# Patient Record
Sex: Female | Born: 1950 | Race: White | Hispanic: Yes | Marital: Married | Smoking: Never smoker
Health system: Southern US, Community
[De-identification: ages and names within clinical notes are randomized; demographics above are authoritative.]

## PROBLEM LIST (undated history)

## (undated) DIAGNOSIS — T4145XA Adverse effect of unspecified anesthetic, initial encounter: Secondary | ICD-10-CM

## (undated) DIAGNOSIS — T8859XA Other complications of anesthesia, initial encounter: Secondary | ICD-10-CM

## (undated) DIAGNOSIS — D649 Anemia, unspecified: Secondary | ICD-10-CM

## (undated) DIAGNOSIS — K625 Hemorrhage of anus and rectum: Secondary | ICD-10-CM

## (undated) HISTORY — DX: Anemia, unspecified: D64.9

## (undated) HISTORY — DX: Hemorrhage of anus and rectum: K62.5

## (undated) HISTORY — PX: CHOLECYSTECTOMY: SHX55

## (undated) HISTORY — PX: COLONOSCOPY: SHX174

---

## 1980-01-19 HISTORY — PX: TUBAL LIGATION: SHX77

## 2004-05-16 ENCOUNTER — Encounter: Admission: RE | Admit: 2004-05-16 | Discharge: 2004-05-16 | Payer: Self-pay | Admitting: Family Medicine

## 2004-11-02 ENCOUNTER — Other Ambulatory Visit: Admission: RE | Admit: 2004-11-02 | Discharge: 2004-11-02 | Payer: Self-pay | Admitting: Family Medicine

## 2004-12-09 ENCOUNTER — Other Ambulatory Visit: Admission: RE | Admit: 2004-12-09 | Discharge: 2004-12-09 | Payer: Self-pay | Admitting: Family Medicine

## 2004-12-12 ENCOUNTER — Ambulatory Visit (HOSPITAL_COMMUNITY): Admission: RE | Admit: 2004-12-12 | Discharge: 2004-12-12 | Payer: Self-pay | Admitting: Family Medicine

## 2005-03-13 ENCOUNTER — Ambulatory Visit (HOSPITAL_COMMUNITY): Admission: RE | Admit: 2005-03-13 | Discharge: 2005-03-13 | Payer: Self-pay | Admitting: Family Medicine

## 2006-06-15 ENCOUNTER — Inpatient Hospital Stay (HOSPITAL_COMMUNITY): Admission: RE | Admit: 2006-06-15 | Discharge: 2006-06-25 | Payer: Self-pay | Admitting: Internal Medicine

## 2006-06-15 ENCOUNTER — Ambulatory Visit: Payer: Self-pay | Admitting: Internal Medicine

## 2006-06-21 ENCOUNTER — Ambulatory Visit (HOSPITAL_COMMUNITY): Admission: RE | Admit: 2006-06-21 | Discharge: 2006-06-21 | Payer: Self-pay | Admitting: Interventional Radiology

## 2006-07-04 ENCOUNTER — Ambulatory Visit: Payer: Self-pay | Admitting: Internal Medicine

## 2006-07-09 ENCOUNTER — Inpatient Hospital Stay (HOSPITAL_COMMUNITY): Admission: AD | Admit: 2006-07-09 | Discharge: 2006-07-13 | Payer: Self-pay | Admitting: Internal Medicine

## 2006-07-09 ENCOUNTER — Ambulatory Visit: Payer: Self-pay | Admitting: Gastroenterology

## 2006-07-26 ENCOUNTER — Ambulatory Visit: Payer: Self-pay | Admitting: Gastroenterology

## 2006-07-30 ENCOUNTER — Ambulatory Visit (HOSPITAL_COMMUNITY): Admission: RE | Admit: 2006-07-30 | Discharge: 2006-07-30 | Payer: Self-pay | Admitting: Internal Medicine

## 2006-08-02 ENCOUNTER — Ambulatory Visit (HOSPITAL_COMMUNITY): Admission: RE | Admit: 2006-08-02 | Discharge: 2006-08-02 | Payer: Self-pay | Admitting: Internal Medicine

## 2006-08-06 ENCOUNTER — Ambulatory Visit: Payer: Self-pay | Admitting: Internal Medicine

## 2006-08-06 ENCOUNTER — Ambulatory Visit (HOSPITAL_COMMUNITY): Admission: RE | Admit: 2006-08-06 | Discharge: 2006-08-06 | Payer: Self-pay | Admitting: Internal Medicine

## 2007-11-18 IMAGING — RF DG ERCP WO/W SPHINCTEROTOMY
1 series · 15 of 15 positions shown · non-contrast
Comparison: none

HISTORY: Bile leak

[Series 1: run · 9 acquisitions, 15 frames shown]
[im 1/9]
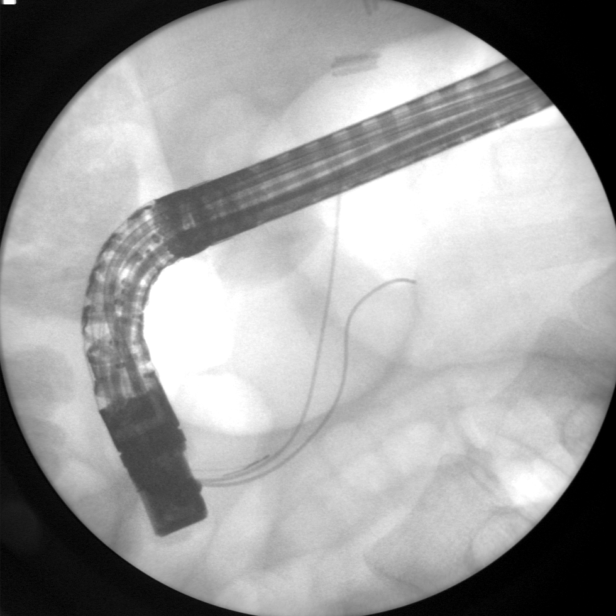
[im 2/9]
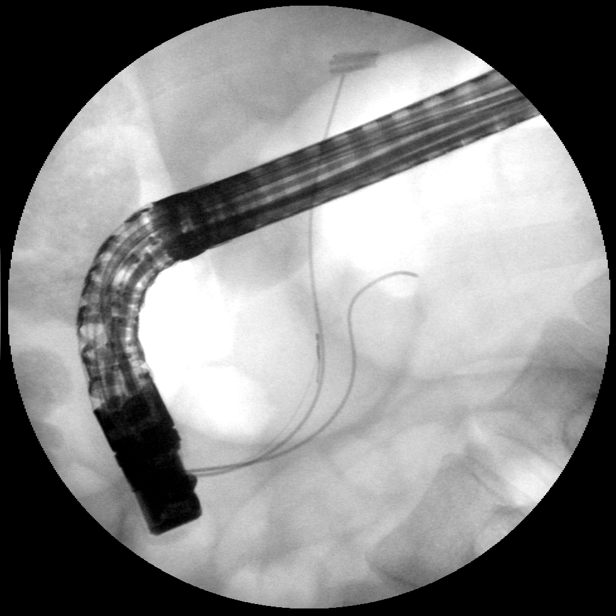
[im 2/9]
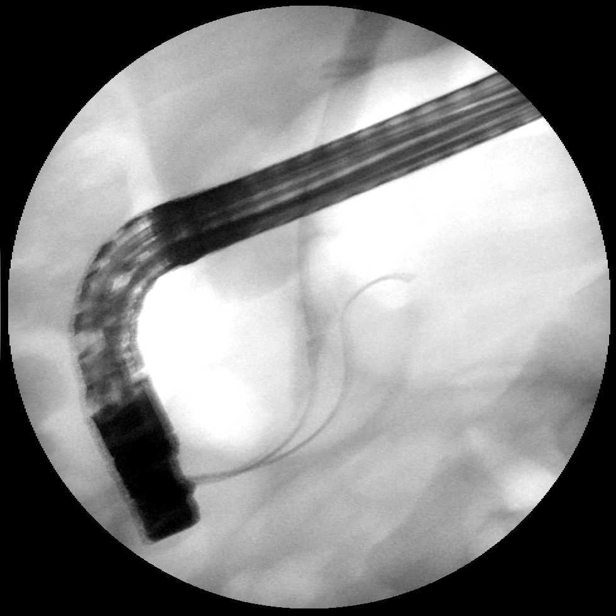
[im 2/9]
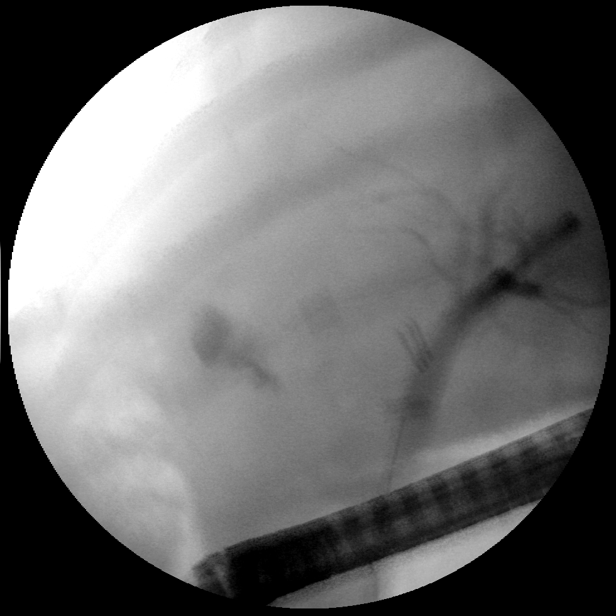
[im 2/9]
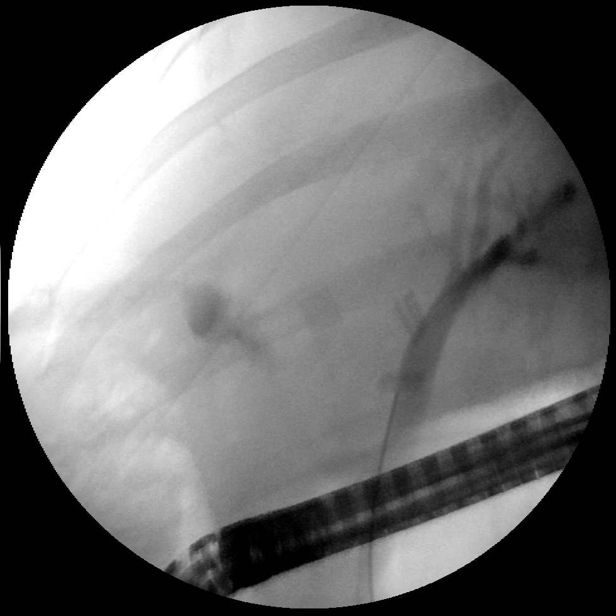
[im 3/9]
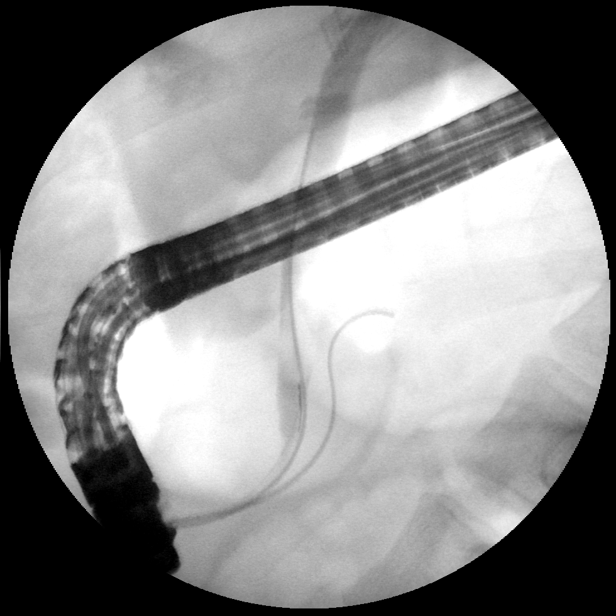
[im 3/9]
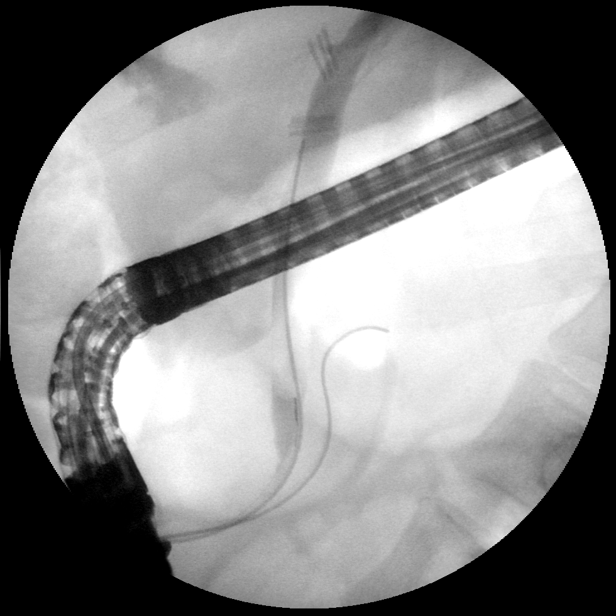
[im 3/9]
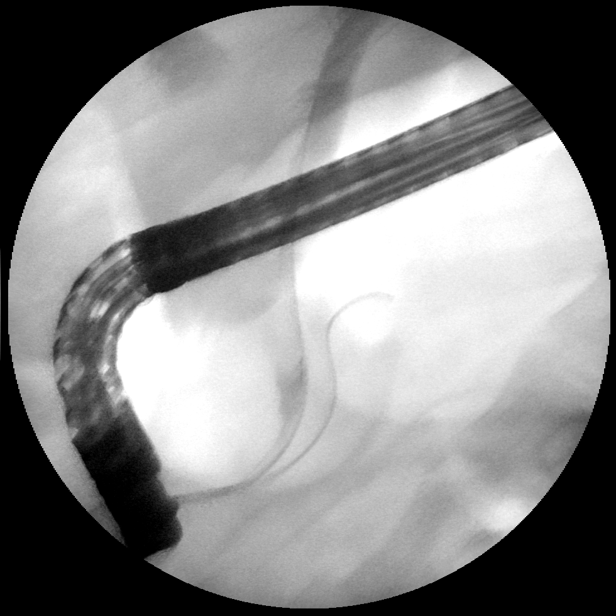
[im 3/9]
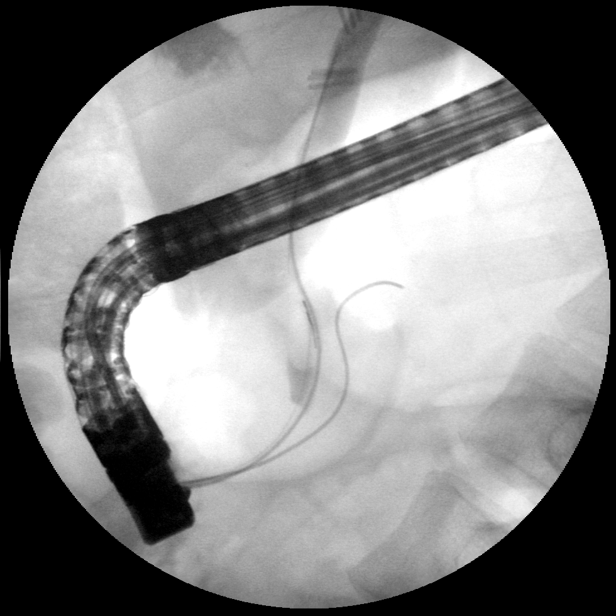
[im 4/9]
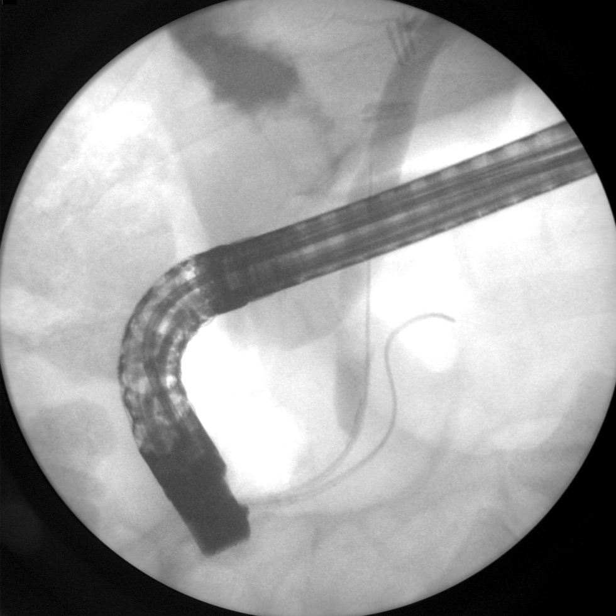
[im 5/9]
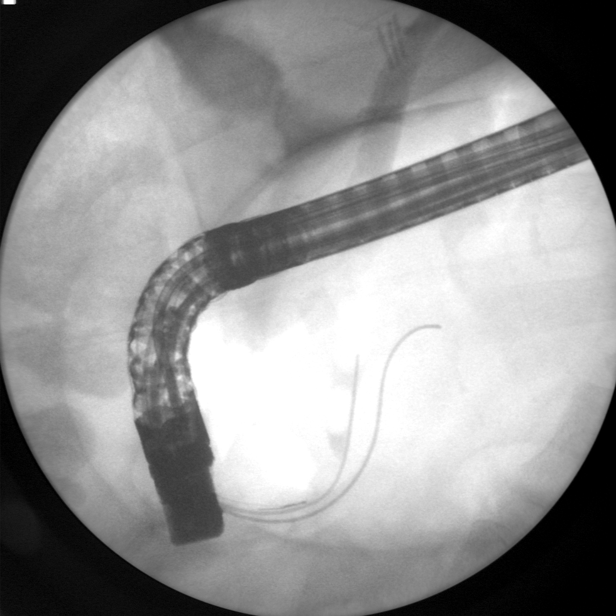
[im 6/9]
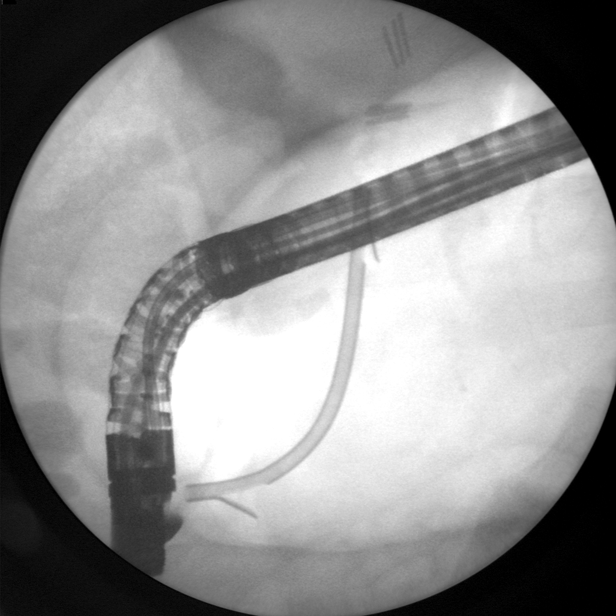
[im 7/9]
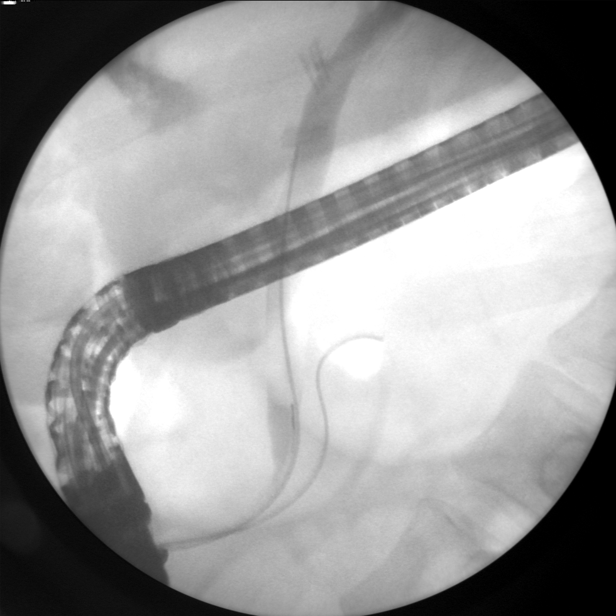
[im 8/9]
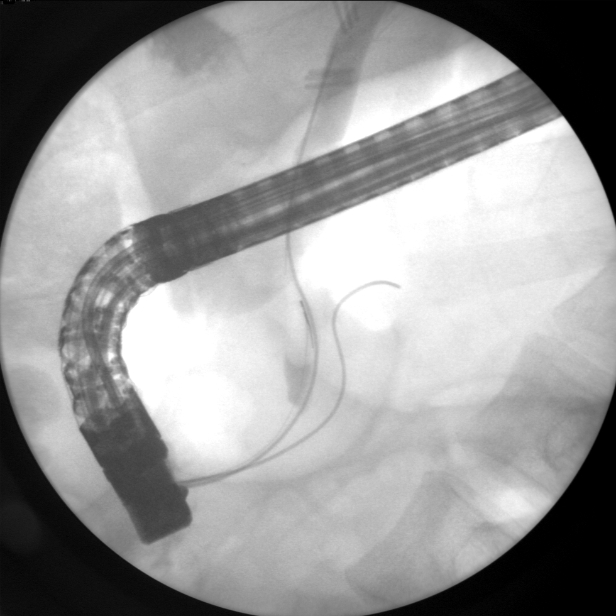
[im 9/9]
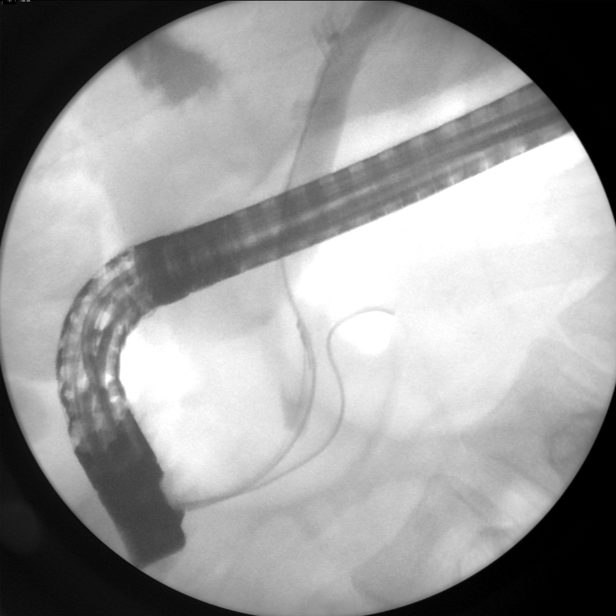

[15 of 15 positions shown; findings below may reference images not displayed]

ERCP WITH SPHINCTEROTOMY:

Procedure performed by Dr. Van Houten.
Submitted images demonstrate placement of wires into distal pancreatic duct and
into common bile duct.
CBD appears normal caliber.
Bile leak identified at gallbladder fossa, which appears to come from an
accessory duct of Luschka.
The leak does not appear to be centered at the cystic duct clips. 
Few air bubbles seen in distal CBD without definite evidence of
choledocholithiasis.
CBD stent placed.
IMPRESSION: Biliary leak identified with small amount of pooled contrast inferior to liver
from what appears to be an accessory duct of Luschka.

## 2008-03-09 ENCOUNTER — Other Ambulatory Visit: Admission: RE | Admit: 2008-03-09 | Discharge: 2008-03-09 | Payer: Self-pay | Admitting: Obstetrics & Gynecology

## 2008-03-11 ENCOUNTER — Ambulatory Visit (HOSPITAL_COMMUNITY): Admission: RE | Admit: 2008-03-11 | Discharge: 2008-03-11 | Payer: Self-pay | Admitting: Obstetrics & Gynecology

## 2008-03-30 ENCOUNTER — Ambulatory Visit (HOSPITAL_COMMUNITY): Admission: RE | Admit: 2008-03-30 | Discharge: 2008-03-30 | Payer: Self-pay | Admitting: Obstetrics & Gynecology

## 2009-01-07 ENCOUNTER — Ambulatory Visit (HOSPITAL_COMMUNITY): Admission: RE | Admit: 2009-01-07 | Discharge: 2009-01-07 | Payer: Self-pay | Admitting: Obstetrics & Gynecology

## 2009-03-12 ENCOUNTER — Other Ambulatory Visit: Admission: RE | Admit: 2009-03-12 | Discharge: 2009-03-12 | Payer: Self-pay | Admitting: Obstetrics & Gynecology

## 2010-03-23 ENCOUNTER — Ambulatory Visit (HOSPITAL_COMMUNITY): Admission: RE | Admit: 2010-03-23 | Discharge: 2010-03-23 | Payer: Self-pay | Admitting: Obstetrics & Gynecology

## 2010-04-11 ENCOUNTER — Other Ambulatory Visit: Admission: RE | Admit: 2010-04-11 | Discharge: 2010-04-11 | Payer: Self-pay | Admitting: Obstetrics & Gynecology

## 2010-04-16 ENCOUNTER — Ambulatory Visit (HOSPITAL_COMMUNITY): Admission: RE | Admit: 2010-04-16 | Discharge: 2010-04-16 | Payer: Self-pay | Admitting: Family Medicine

## 2010-11-20 ENCOUNTER — Encounter: Payer: Self-pay | Admitting: Family Medicine

## 2010-11-20 ENCOUNTER — Encounter (INDEPENDENT_AMBULATORY_CARE_PROVIDER_SITE_OTHER): Payer: Self-pay | Admitting: Internal Medicine

## 2011-03-17 NOTE — H&P (Signed)
Cassandra Welch, Cassandra Welch                 ACCOUNT NO.:  [id number]   MEDICAL RECORD NO.:  [id number]          PATIENT TYPE:  AMB   LOCATION:  DAY                           FACILITY:  [facility]   PHYSICIAN:  Lionel December, M.D.    DATE OF BIRTH:  [birth year]   DATE OF ADMISSION:  06/15/2006  DATE OF DISCHARGE:  LH                                HISTORY & PHYSICAL   REASON FOR ADMISSION/TRANSFER:  Biliary leak.   HISTORY OF PRESENT ILLNESS:  Cassandra Welch is a 60 year old Hispanic female who  underwent laparoscopic cholecystectomy 3 days ago at [facility]. She was discharged  the following day which was 2 days ago and was doing fine. Yesterday  evening, she developed sharp pain in her right upper quadrant. Pain has been  gradually getting worse. It became unbearable. She was readmitted to [facility]  early this morning. She had hepatobiliary scan. She had a leak suspected to  be from gallbladder fossa. The patient therefore was transferred to [facility] for therapeutic ERCP as requested by Dr. Gabriel Cirri. The patient now  complains of severe pain in right upper quadrant as well as right shoulder.  Pain gets worse when she tries to take a deep breath. She does not feel  short of breath. She does not experience fever, chills, nausea or vomiting.  She recently has lost 10 pounds, both voluntary and involuntary weight loss.   MEDICATIONS:  Her usual medications include Activella daily. She is also on  antibiotics she was given at time of discharge.   PAST MEDICAL HISTORY:  1. She had tubal ligation years ago.  2. Laparoscopic cholecystectomy on June 12, 2006.   ALLERGIES:  NK.   FAMILY HISTORY:  Father had diabetes mellitus and CAD and died last month at  age 25. Mother is in good health.   SOCIAL HISTORY:  She is married and has 4 children. She is a housewife. She  is [place] from [place] over [place] years [place]. She does not smoke  cigarettes or drink alcohol.   PHYSICAL EXAMINATION:  GENERAL:  Pleasant,  well-developed, well-nourished  female who appears to be in distress.  VITAL SIGNS:  Pulse 74 per minute, blood pressure 174/87, temperature 99.9.  Admission weight 59 kg. She is 5 feet tall.  HEENT:  Conjunctivae is pink. Sclerae is nonicteric. Oropharyngeal mucosa is  normal.  NECK:  No neck masses or thyromegaly noted.  CARDIAC:  Regular rate normal S1 and S2, no murmur or gallop noted.  LUNGS:  Clear to auscultation.  ABDOMEN:  Full. She has dressing over laparoscopy wounds. Lower half of the  abdomen is soft. She has moderate tenderness and guarding in right quadrant.  No organomegaly or masses. Examination is somewhat limited because of the  pain.  EXTREMITIES:  She does not have peripheral edema or clubbing.   LABORATORY DATA:  From this morning done at [facility], WBC is 9.7, H and H 13.3  and 38.6, platelet count 203,000. Bilirubin 0.6, AP 134, AST 185, ALT 151,  amylase 133.   ASSESSMENT:  Cassandra Welch is a 60 year old female  who is status post laparoscopic  cholecystectomy 3 days ago who presents with severe pain at right upper  quadrant as well as right shoulder who is documented to have biliary leak on  hepatobiliary scan. She has mildly elevated alkaline phosphatase and  transaminases and therefore could also have choledocholithiasis.   The patient has been given 3 g of Unasyn IV prior to transfer here.   RECOMMENDATIONS:  Will check a PT/PTT just in case sphincterotomy is needed.  She will have ERCP with biliary stenting. However, if she has  choledocholithiasis, she may have to undergo a sphincterotomy and stone  extraction as well.   I have reviewed the procedure risks with the patient and her husband, and  they are agreeable.   We would like to thank Dr. Gabriel Cirri for the opportunity to participate in the  care of this nice lady.      Lionel December, M.D.  Electronically Signed     NR/MEDQ  D:  06/15/2006  T:  06/15/2006  Job:  [id number]

## 2011-03-17 NOTE — Discharge Summary (Signed)
Cassandra Welch, Cassandra Welch                 ACCOUNT NO.:  [id number]   MEDICAL RECORD NO.:  [id number]          PATIENT TYPE:  INP   LOCATION:  A327                          FACILITY:  [facility]   PHYSICIAN:  Lionel December, M.D.    DATE OF BIRTH:  [birth year]   DATE OF ADMISSION:  06/15/2006  DATE OF DISCHARGE:  08/27/2007LH                                 DISCHARGE SUMMARY   PRINCIPAL DIAGNOSES:  1. Post laparoscopic cholecystectomy subhepatic leak.  2. Post endoscopic retrograde cholangiopancreatography pancreatitis.   PRINCIPAL PROCEDURE:  1. Endoscopic retrograde cholangiopancreatography with sphincterotomy and      biliary stenting on June 15, 2006 by Dr. Karilyn Cota.  2. Percutaneous drainage of subhepatic fluid collection (biloma) on June 21, 2006 by Dr. Fredia Sorrow.   CONDITION AT THE TIME OF DISCHARGE:  Much improved.   DISCHARGE MEDICATIONS:  1. AcipHex 20 mg p.o. q.a.m.  2. Cipro 500 mg p.o. b.i.d. for 10 days.  3. Dilaudid 2-4 mg t.i.d. p.r.n. pain.  4. Pancrease 3 capsules each meal and one with snack.  5. Tylenol OTC p.r.n.  6. Activella p.o. daily which she can resume.   HOSPITAL COURSE:  The patient is a 60 year old Hispanic female who underwent  a laparoscopic cholecystectomy by Dr. Gabriel Cirri of [facility] on June 12, 2006.  She was readmitted with right upper quadrant pain.  She had a hepatobiliary  scan at [facility] which confirmed biliary leak.  She was therefore transferred to  [facility] for ERCP.  ERCP was performed on the day of transfer.  She had  subhepatic or duct of Luschka leak.  Sphincterotomy was performed and a 10-  [place] 7 cm long biliary stent was placed.  The right shoulder pain resolved  following stenting but right upper quadrant pain did not and she also  developed epigastric pain secondary to post ERCP pancreatitis.  Her serum  amylase bumped to 2208 and gradually came down.  On June 19, 2006 it was  144.  Her transaminases were mildly elevated and gradually  improved although  there was a slight bump prior to discharge.  Her epigastric pain gradually  improved.  She was given IV analgesia made n.p.o. she was also given IV PPI.  Initially she was on Unasyn and subsequently switched to Zosyn.  She was  also having low grade fever and persistent leukocytosis. Therefore she had a  CT on August 2007 which showed fluid collection at gallbladder fossa with  few air bubbles.  She also had a pneumobilia felt to be due to  sphincterotomy and stenting.  She had fluid surrounding the pancreas  consistent with acute pancreatitis.  She had bilateral pleural effusions  right greater than the left.  She also had free fluid in the pelvis and a 47  mm left ovarian cyst which is chronic and has been deemed to be stable on  previous studies.  While her pancreatitis improved, she remained with pain  in the right upper quadrant.  She was therefore sent over to an [facility] for  percutaneous drainage of the subhepatic fluid  collection.  This was  performed by Dr. Fredia Sorrow on June 21, 2006.  Bilious fluid was removed.  Drain was left in.  This fluid was negative for white cells and  microorganism suggesting a sterile fluid collection.  There was almost  immediate improvement in her pain following placement of this catheter.  She  was begun on a diet which she tolerated well.  She was switched to p.o.  antibiotic and analgesia and did well.  We felt that she could not tolerate  the Tylox. She was switched to Dilaudid.  Follow-up CT was obtained on  June 25, 2006.  Fluid collection at the gallbladder fossa was much  smaller.  She had fluid collection in the right colonic gutter and one  anterior of the pancreas.  It was felt that peripancreatic fluid was a small  pseudocyst measuring about 4 x 3 cm and it was felt that it may gradually  resolve.  Her lab data on June 25, 2006 revealed WBC of 13.3, H&H of 11.2  and 32.1, platelet count was 471K. Her albumin was down to  2.2.  Her bili  was 0.7, AP was 178, AST 43 and ALT 45.   The percutaneous drain was removed at bedside.  The site was covered with  Betadine ointment and sterile dressing was applied.  The patient was doing  well by the evening of  June 25, 2006 and it was felt she was ready for  discharge.   The patient will be on a low-fat diet.   PLAN:  The plan is for her to be seen in the office in 1 week.   She will have a follow-up CT possibly in 4-8 weeks depending on her clinical  course.   She will have her biliary stent removed following that study.      Lionel December, M.D.  Electronically Signed     NR/MEDQ  D:  06/27/2006  T:  06/28/2006  Job:  [id number]   cc:   Erskine Speed, M.D.  Fax: [phone]   Magnus Sinning. Rice, M.D.  Fax: [phone]

## 2011-03-17 NOTE — Op Note (Signed)
Cassandra Welch, Cassandra Welch                 ACCOUNT NO.:  [id number]   MEDICAL RECORD NO.:  [id number]          PATIENT TYPE:  AMB   LOCATION:  DAY                           FACILITY:  [facility]   PHYSICIAN:  Lionel December, M.D.    DATE OF BIRTH:  1951-01-03   DATE OF PROCEDURE:  06/15/2006  DATE OF DISCHARGE:                                 OPERATIVE REPORT   PROCEDURE:  ERCP with sphincterotomy and biliary stenting.   INDICATIONS:  Logann is a 60 year old female who has had a lap chole 3 days  ago and now presents with biliary leak.  HIDA scan suggested subhepatic  leak.  The procedure was reviewed with the patient and informed consent was  obtained.   MEDS FOR SEDATION/ANESTHESIA:  Please see anesthesia record for details.   For the procedure, she was given glucagon 0.5 mg IV.   FINDINGS:  Procedure performed in the OR.  After she was placed under  anesthesia, she was intubated and placed in the semiprone position.  The  therapeutic Olympus video duodenoscope was passed via the oropharynx and  esophagus and stomach without any difficulty.  The antral mucosa was normal,  pyloric channel was patent.  The scope was passed across the pyloric  channel, bulb and descending duodenum.  The ampulla of Vater appeared to be  normal.  The scope was straightened, cannulation was attempted with the RX  44 autotome and 0.035 hydra Jagwire.  A small ampullary orifice.  Pancreatic  duct was initially cannulated with a guidewire.  After two attempts, I  decided to leave this wire in the pancreatic duct.  A sphincterotome was  reloaded with another wire.  A CBD was then selectively cannulated.  As the  dilute contrast was injected, there was extravasation from subhepatic site  which was felt to be duct of Luschka leak. No definite filling defect or  stone was noted in the CBD.  A small sphincterotomy was performed and a 10-  [place] 5 cm biliary stent was placed to bile duct without any difficulty.  As the stent  was disengaged and deployed, there was a gush of bile and  contrast into the duodenum.  The other guidewire which was in the pancreatic  duct was also removed.  The endoscope was withdrawn.  The patient tolerated  the procedure well.  She is in the process of being extubated.   FINAL DIAGNOSIS:  Subhepatic biliary leak from duct of Luschka.  Status post  biliary stenting after performing small sphincterotomy.   PLAN:  1. She will be admitted.  2. Will leave her on IV Unasyn another 3 doses.  3. She will have CBC, LFTs and clear liquids in a.m.  4. Dilaudid IV for pain control.      Lionel December, M.D.  Electronically Signed     NR/MEDQ  D:  06/15/2006  T:  06/15/2006  Job:  [id number]   cc:   Erskine Speed, M.D.  Fax: [phone]

## 2011-03-17 NOTE — H&P (Signed)
NAMEDAVINE, SWENEY                 ACCOUNT NO.:  [id number]   MEDICAL RECORD NO.:  [id number]          PATIENT TYPE:  AMB   LOCATION:                                FACILITY:  [facility]   PHYSICIAN:  Lionel December, M.D.    DATE OF BIRTH:  1951-10-14   DATE OF ADMISSION:  DATE OF DISCHARGE:  LH                                HISTORY & PHYSICAL   REASON FOR VISIT:  Follow-up for recent hospitalization for nausea and  vomiting, history of bile leak status post biliary stenting.   HISTORY OF PRESENT ILLNESS:  Cassandra Welch is a 60 year old Hispanic female who  underwent laparoscopic cholecystectomy on June 12, 2006.  This was  complicated by biliary leak.  She was transferred from [facility] where she had her surgery to [facility] after a HIDA scan  confirmed a leak.  She had ERCP on June 15, 2006.  She was found to have a  subhepatic leak.  She had a sphincterotomy and placement of the biliary  stents.  Post ERCP she developed pancreatitis.  She had fluid collection of  the gallbladder fossa which had to be drained percutaneously.  Follow-up CT  August 27 revealed the fluid collection had completely resolved and the  drain was removed; however, she appeared to have a fluid collection from the  pancreas suspicious for a small pseudocyst.  She was readmitted on July 09, 2006 because of nausea and vomiting.  It was suspected that she may have  a clogged stent versus worsening of her pancreatitis.  Follow-up CT on  September 10, however, revealed interval improvement of inflammatory changes  about the pancreas with near complete resolution of the fluid collection  along the right psoas muscle, decreased fluid collection anterior to the  pancreas compatible with improving the pseudocyst formation.  Her LFTs were  unremarkable.  Etiology of recurrent nausea and vomiting was unclear.  She  was found to have headache and nausea and vomiting related to narcotic  medication  in the hospital which resolved with change in medications.  She  presents today stating that she is doing great.  Her appetite is perfect.  She has no abdominal pain.  Her bowel movements are normal.  She is very  pleased with her progress.  She is not on any medications.   CURRENT MEDICATIONS:  None.   ALLERGIES:  NO KNOWN DRUG ALLERGIES.   PHYSICAL EXAMINATION:  VITAL SIGNS:  Weight 123.5, height 5 feet,  temperature 98.5, blood pressure 126/84, pulse is 80.  GENERAL:  Pleasant, well-nourished, well-developed Hispanic female in no  acute distress.  SKIN:  Warm and dry, no jaundice.  HEENT:  Conjunctivae are pink.  Sclerae are nonicteric.  Oropharyngeal  mucosa normal.  CHEST:  Lungs clear to auscultation.  CARDIAC:  Regular rate and rhythm, normal S1, S2, no murmurs, rubs or  gallops.  ABDOMEN:  Positive bowel sounds, soft nontender, nondistended, no  organomegaly or masses, no rebound, tenderness or guarding.  No abdominal  bruits or hernias.  Laparoscopy scars are healing nicely.  EXTREMITIES:  Edema.   IMPRESSION:  Cassandra Welch is a 60 year old lady who has had a complicated history  status post laparoscopic cholecystectomy back in August of 2007.  She is 6  weeks post endoscopic retrograde cholangiopancreatography with biliary  stenting.  It is time to remove her biliary stent.  Based on her symptoms  would suspect her pancreatic pseudocyst have continued to resolve.  Patient  was seen by Dr. Karilyn Cota as well.   PLAN:  1. Abdominal ultrasound to follow up pancreatic pseudocyst.  Will also      check to make sure the biliary stents are still present.  Assuming so,      she will undergo ERCP with stent removal sometime within the next one      to two weeks.      Tana Coast, P.A.      Lionel December, M.D.  Electronically Signed    LL/MEDQ  D:  07/26/2006  T:  07/26/2006  Job:  [id number]

## 2011-03-17 NOTE — Discharge Summary (Signed)
Cassandra Welch, Cassandra Welch                 ACCOUNT NO.:  [id number]   MEDICAL RECORD NO.:  [id number]          PATIENT TYPE:  INP   LOCATION:  A301                          FACILITY:  [facility]   PHYSICIAN:  Lionel December, M.D.    DATE OF BIRTH:  [birth year]   DATE OF ADMISSION:  07/09/2006  DATE OF DISCHARGE:  09/14/2007LH                                 DISCHARGE SUMMARY   ADMITTING DIAGNOSES:  1. Recurrent nausea, vomiting, not controlled with outpatient therapy.  2. History of subhepatic leak following laparoscopic cholecystectomy,      leading to endoscopic retrograde cholangiopancreatography with biliary      stenting and complicated by pancreatitis.  3. History of pancreatic pseudocyst.   DISCHARGE DIAGNOSES:  1. Recurrent nausea and vomiting, unclear etiology.  No evidence of      biliary stent occlusion or worsening pancreatitis.  2. There was improvement in the inflammatory changes around the pancreas      with near complete resolution of fluid collection along the right psoas      muscle and decreasing fluid collection anterior to the pancreas      compatible with improving pseudocyst formation.  There was only a tiny      amount of residual gallbladder fossa fluid present.   PROCEDURES:  CT of the abdomen and pelvis, July 09, 2006, with contrast  revealed a stent in the common bile duct, a small fluid collection anterior  to the pancreas and about the SMV measuring 2.1 x 2.9 which was somewhat  smaller than before.  Fluid which had tracked down along the right psoas  muscle no longer identified.  Inflammatory changes to pancreas were  improved.   HISTORY OF PRESENT ILLNESS:  The patient is a 60 year old Hispanic female  who underwent laparoscopic cholecystectomy on June 12, 2006.  Surgery was  at [facility] and complicated by biliary leak.  She was  transferred to [facility] for definitive management.  She had an  ERCP with sphincterotomy and  placement of biliary stents.  She developed  post ERCP pancreatitis.  Followup CT June 25, 2006, revealed fluid  collection was completely resolved therefore a percutaneous drain was  removed.  However, she had a fluid collection in front of the pancreas  suspicious for small pseudocyst.  Because of persistent nausea unmanageable  as an outpatient she was readmitted.  It was felt that she may have an  occluded biliary stent versus worsening of her pancreatitis.   HOSPITAL COURSE:  The patient underwent CT of the abdomen and pelvis as  outlined above.  As her pancreatic pseudocysts were improving and her stents  were open, the etiology of her nausea, vomiting was unclear.  Dr. Karilyn Cota  discussed the case with Dr. __________  at [facility].  Question was whether or not to go ahead and remove her  biliary stents.  But, it was recommended to go ahead and wait the entire 6  weeks to allow the biliary leakage to completely resolve.  The patient was  given a trial of  clear liquids and her Pancrease was resumed.  By the  following day, she actually was feeling much better.  She was ambulating in  her room, tolerating clear liquids, and eventually full liquids.  She  developed headache and began taking Dilaudid for pain.  By day 3 of her  hospitalization, she started having vomiting which was felt to be due to  narcotic medication.  She also was having intolerance to Tylox.  She was not  getting better with just Zofran, so Phenergan was added.  She was made NPO  again.  After nausea, vomiting resolved, she was started again on clear  liquid diet and advanced quite quickly to a low fat diet.  By the day of  discharge, she had no further vomiting in over 24 hours, was tolerating an  advanced diet, and having minimal if any abdominal pain and was ready for  discharge.   LABORATORY:  During hospitalization revealed a completely normal CBC.  Her  LFTs were normal  except for albumin of 3.4 and her alkaline phosphatase was  minimally elevated at 119.  Her amylase initially was 295.  It was down to  253 on July 10, 2006.   PHYSICAL EXAMINATION:  VITAL SIGNS:  Day of discharge, vital signs were  stable.  CHEST:  Lungs clear to auscultation.  CARDIAC:  Revealed regular rate and rhythm.  ABDOMEN:  Soft, nontender, nondistended.  Positive bowel sounds.  LOWER EXTREMITIES:  No edema.   CONDITION ON DISCHARGE:  Stable and much improved.   DISCHARGE MEDICATIONS:  1. Aciphex 20 mg p.o. q.a.m.  2. She will resume Activella p.o. daily.  3. Tylenol OTC p.r.n.  4. Resume Lexapro 20 mg p.o. daily, recently given to her by Dr. Celene Skeen.   DIET:  Low fat diet.   ACTIVITY:  No restrictions.   She has a followup appointment with me on July 26, 2006 at 10:45.  We  will arrange for biliary stent removal at that time.   If she has any recurrent nausea or vomiting, abdominal pain, or fever, she  will notify us immediately.      Tana Coast, P.A.      Lionel December, M.D.  Electronically Signed    LL/MEDQ  D:  07/13/2006  T:  07/13/2006  Job:  [id number]   cc:   Charlesetta Shanks  Fax: [phone]

## 2011-03-17 NOTE — H&P (Signed)
NAMEMARIETTA, Cassandra Welch                 ACCOUNT NO.:  [id number]   MEDICAL RECORD NO.:  [id number]          PATIENT TYPE:  INP   LOCATION:  A301                          FACILITY:  [facility]   PHYSICIAN:  Lionel December, M.D.    DATE OF BIRTH:  29-Oct-1951   DATE OF ADMISSION:  07/09/2006  DATE OF DISCHARGE:  LH                                HISTORY & PHYSICAL   PRESENTING COMPLAINT:  Nausea, vomiting, abdominal pain.   HISTORY OF PRESENT ILLNESS:  Cassandra Welch is a 60 year old Hispanic female who  underwent laparoscopic cholecystectomy on June 12, 2006.  Surgery was at  [facility] and was complicated by biliary leak.  She was transferred here on June 15, 2006, from [facility] after having HIDA scan confirming a leak.  She had an  ERCP the day of admission.  She had subhepatic leak.  She had sphincterotomy  and placement of biliary stents.  Post ERCP she developed pancreatitis.  She  also had a fluid collection at the gallbladder fossa which had to be drained  percutaneously.  The patient had followup CT on June 25, 2006.  Fluid  collection was almost completely resolved.  Therefore, percutaneous drain  was removed.  She had what appeared to be fluid collection in front of the  pancreatic body suspicious for a small pseudocysts.  She was tolerating low-  fat diet along with a pancreatic enzyme supplement and she was discharged.  She was seen in the office last week.  She was still having some discomfort  and nausea.  She was given Phenergan.  Last week she was also seen by Dr.  Celene Skeen and given [facility] and Lexapro.  However, she has not been able to  keep these medicines down.  She states the last time she took pain medicine  was on Friday night.  She vomited a couple of times yesterday, around 1 a.m.  this morning, again when she tried to eat breakfast.  She contacted me and  she was asked to come to the hospital to be hospitalized for further  evaluation.  She has lost about 15 or 16 pounds with her  illness.  She had  fever and chills about 4-5 days prior to admission but not over the weekend.  She has noted some pain in the right ribcage which comes and goes.  She has  had intermittent diarrhea.  She denies melena, rectal bleeding or  hematemesis.  She denies dyspnea or cough.   She is presently on:  1. AcipHex 20 mg p.o. q.a.m.  2. Dilaudid 2-4 mg t.i.d. p.r.n., which she has not taken over 48 hours.  3. Pancrease three with each meal and one with snack.  4. Activella p.o. daily.  5. Lexapro 20 mg daily just initiated recently.  6. Emetrol p.r.n..  7. Phenergan 12-25 mg b.i.d. p.r.n., but she has not taken this in the      last 4 or 5 days.   PAST MEDICAL HISTORY:  She had tubal ligation several years ago and a  laparoscopic cholecystectomy on June 12, 2006.   ALLERGIES:  None known.   SOCIAL HISTORY:  She is married.  She has four children.  She is a  housewife.  [place] but has been living in the  [place] for over 30 years.  She has never smoked cigarettes and does not drink  alcohol.   FAMILY HISTORY:  Positive for diabetes mellitus and CAD in father, who died  recently at age 26.  Mother is in good health.   PHYSICAL EXAMINATION:  GENERAL:  Pleasant, well-developed, thin female who  is in no acute distress.  VITAL SIGNS:  She weighs 116.6 pounds.  She was 125 pounds at the office  last week.  She is 5 feet tall.  Pulse 77 per minute, blood pressure 145/97,  respirations 20, and temperature is 97.0.  HEENT:  Conjunctivae are pink.  Sclerae are nonicteric.  Oral pharyngeal  mucosa is normal.  No neck masses noted.  LUNGS:  Clear to auscultation.  CARDIAC:  With regular rhythm.  Normal S1 and S2.  No murmur or gallop  noted.  ABDOMEN:  Symmetrical, bowel sounds are normal.  Palpation reveals soft  abdomen with mild midepigastric tenderness on deep palpation.  No hepato- or  splenomegaly.  RECTAL:  Examination is deferred.  EXTREMITIES:  She  has no clubbing or edema.   ASSESSMENT:  Cassandra Welch is a 60 year old female who had subhepatic leak following  laparoscopic cholecystectomy last month leading to ERCP with biliary  stenting, complicated by pancreatitis.  She has not completely recovered  from this illness.  She now presents with recurrent nausea and vomiting not  controlled with outpatient therapy.  Her exam is relatively benign except  for mild midepigastric tenderness.  She either has occluded biliary stent or  enlarging pseudocyst.   PLAN:  1. IV fluids with IV PPI.  Will use Dilaudid for pain and Zofran for      nausea/vomiting.  2. She will have CBC, LFTs, amylase, as well as Met 7.  Abdominopelvic CT      with oral/IV contrast.  3. Further, if she has enlarging pseudocyst she will need to have it      drained, for which she will need to be sent to tertiary center.      However, if she has suggestion of occluded biliary stent this will be      removed as soon as feasible.      Lionel December, M.D.  Electronically Signed     NR/MEDQ  D:  07/09/2006  T:  07/09/2006  Job:  [id number]   cc:   Charlesetta Shanks  Fax: [phone]   Erskine Speed, M.D.  Fax: [phone]

## 2011-03-17 NOTE — Op Note (Signed)
NAMEKRYSTALLE, Cassandra Welch                 ACCOUNT NO.:  [id number]   MEDICAL RECORD NO.:  [id number]          PATIENT TYPE:  AMB   LOCATION:  DAY                           FACILITY:  [facility]   PHYSICIAN:  Lionel December, M.D.    DATE OF BIRTH:  1951-10-11   DATE OF PROCEDURE:  08/06/2006  DATE OF DISCHARGE:                                 OPERATIVE REPORT   PROCEDURE:  ERCP with biliary stent removal.   Removal of biliary stent.   INDICATIONS:  Cassandra Welch is a 60 year old female who has post cholecystectomy  subhepatic biliary leak for which she had ERCP with stenting on June 15, 2006.  Her course was complicated. She had biloma drained percutaneously and  she also had pancreatitis of the pseudocyst which has completely resolved on  recent ultrasound stent is in place.  She is now free of any GI symptoms.  She is undergoing ERCP with removal of stent and to document that leak has  healed completely.  Procedure risks were reviewed with the patient, informed  consent was obtained.   PREOP MEDS:  Please see anesthesia record for details.   FINDINGS:  Procedure performed in OR.  After the patient was placed under  anesthesia, she was intubated and positioned in semi prone position.  Olympus video duodenoscope was passed oropharynx without any difficulty into  esophagus, stomach and across the pylorus, bulb and descending duodenum.  Stent was in place.  Was patent.  It was caught with snared and removed  under fluoroscopic control.  Endoscope was passed again and CBD cannulated  easily with RX 44 Autotome and 035 hydra Jag wire.  Dilute contrast was  injected.  There were no filling defects or extravasation was noted.  The  sphincterotome was exchanged with stone balloon extractor and occlusion  cholangiogram was obtained.  Intrahepatic biliary system was gradually  filled completely.  No extravasation was noted.  Dr. Pia Mau was asked to  come and look at the images in he agreed.  Catheter and  guidewire was  removed.  Endoscope was withdrawn.  The patient tolerated the procedure well  and she is in the process of being extubated.   PLAN:  She will be going home later this morning.   She will call us should she experience abdominal pain, otherwise follow with  Dr. Celene Skeen, her primary care physician.      Lionel December, M.D.  Electronically Signed    NR/MEDQ  D:  08/06/2006  T:  08/06/2006  Job:  [id number]   cc:   Charlesetta Shanks  Fax: [phone]

## 2011-08-26 IMAGING — US US BREAST*R*
1 series · 4 of 4 positions shown · non-contrast
Comparison: 03/11/2008

CLINICAL DATA: The patient returns for short interval follow-up of
probably benign apocrine metaplasia in the right upper outer
quadrant.

DIGITAL DIAGNOSTIC BILATERAL MAMMOGRAM WITH CAD AND RIGHT BREAST
ULTRASOUND:

[Series 1: us breast*right* · 0.08mm/px · 4 of 4 slices shown]
[im 1/4]
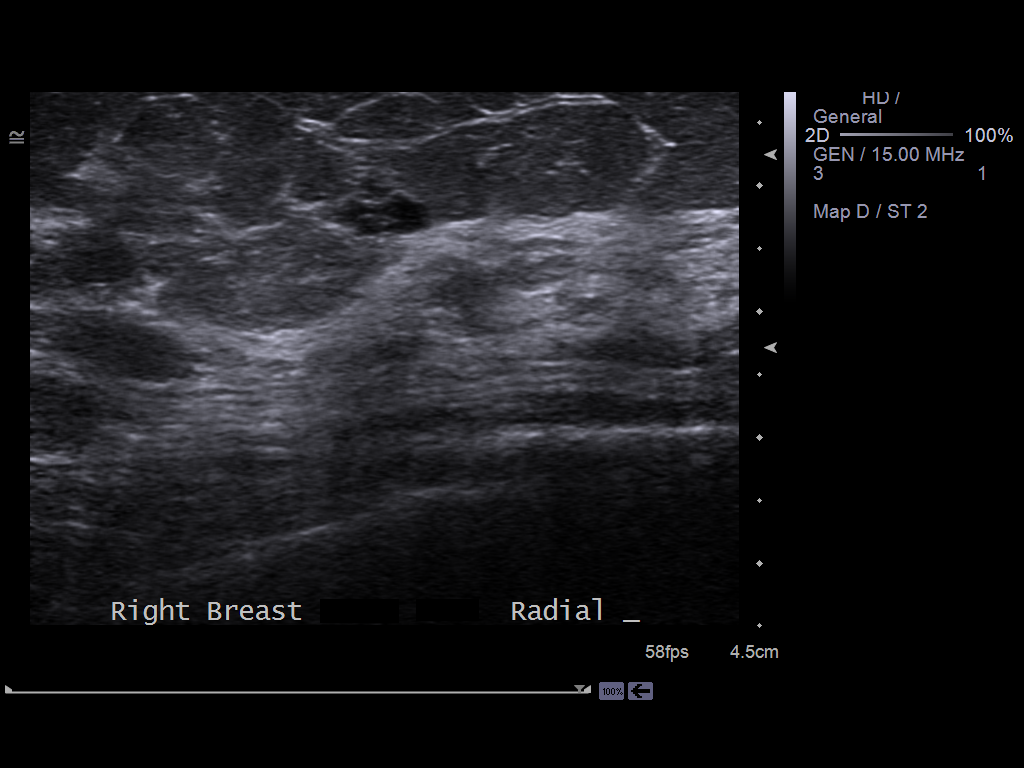
[im 2/4]
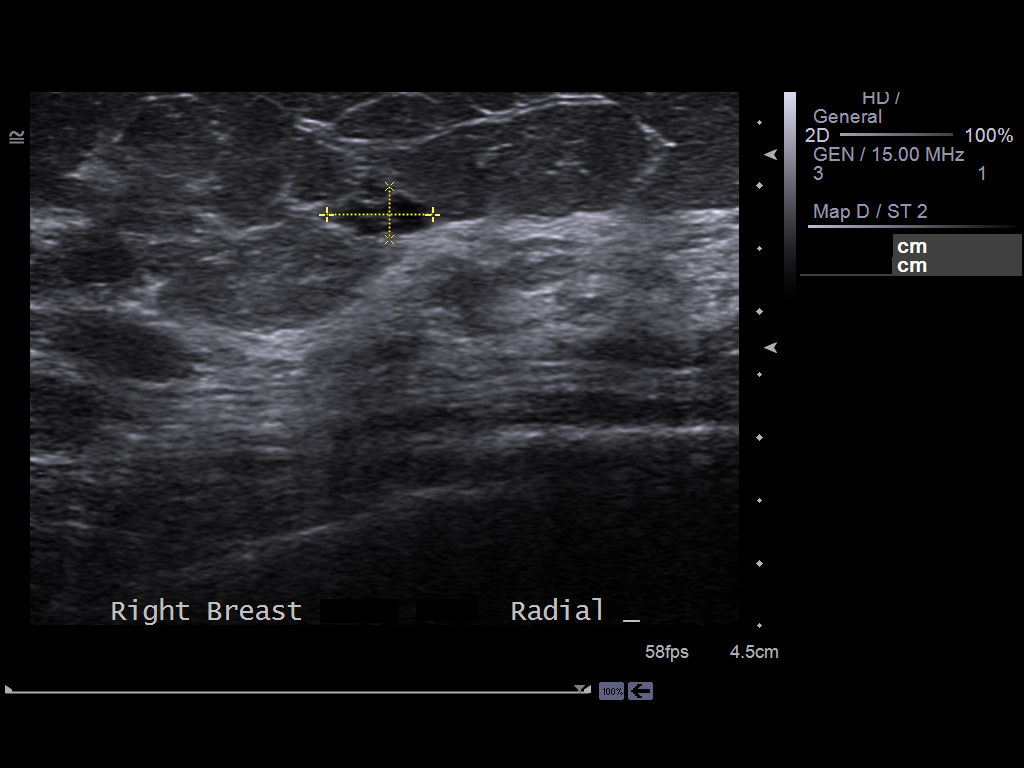
[im 3/4]
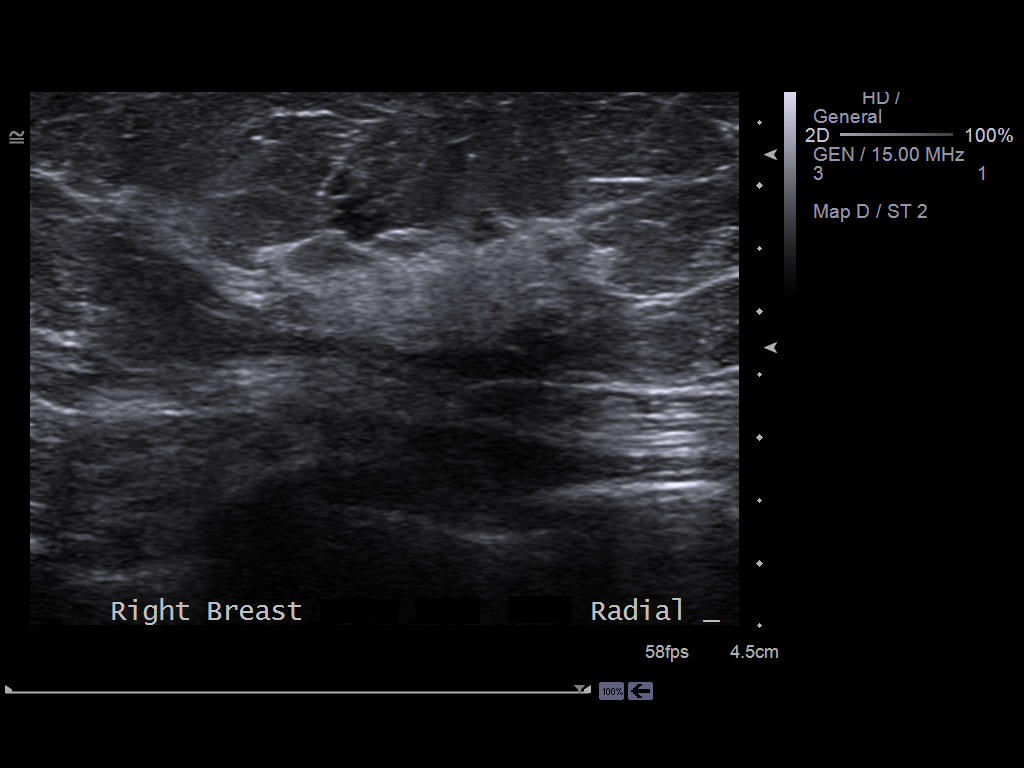
[im 4/4]
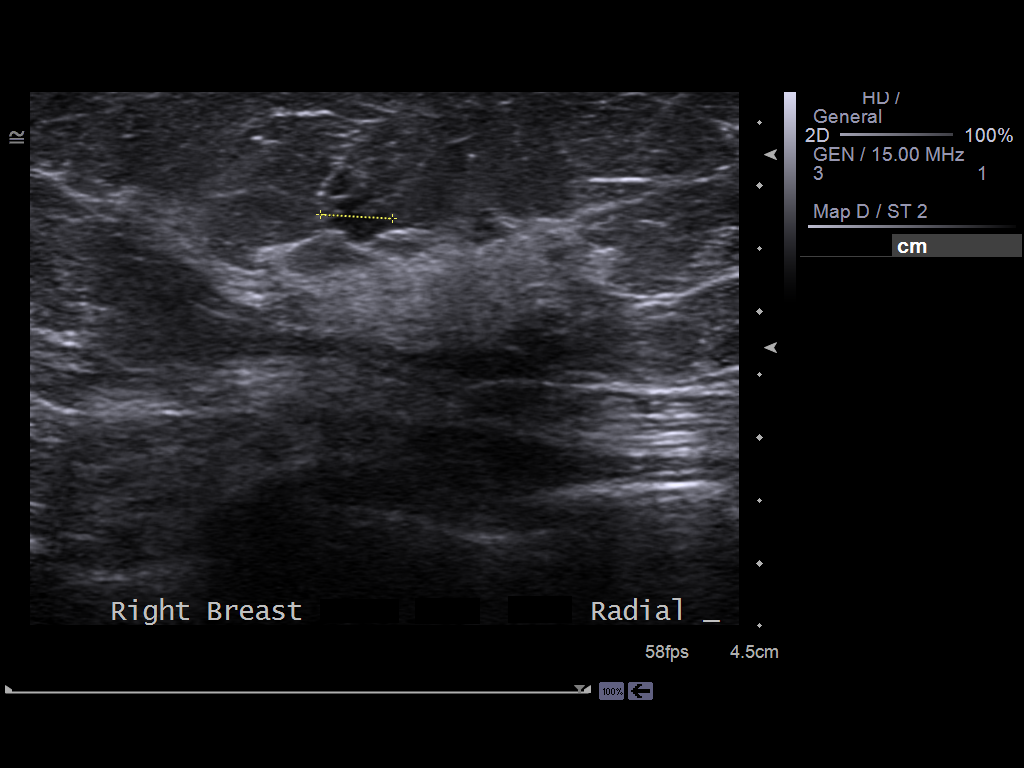

[4 of 4 positions shown; findings below may reference images not displayed]

FINDINGS: There are scattered fibroglandular densities.  Scattered
bilateral nodular densities consistent with cysts are stable.
There is no suspicious dominant mass, architectural distortion or
calcification to suggest malignancy.
Mammographic images were processed with CAD.

On physical exam, no mass is palpated in the right upper outer
quadrant.

Ultrasound is performed, showing clustered cysts at 10 o'clock 7 cm
from the right nipple measuring approximately 6 x 8 x 4 mm.  This
is stable and benign in appearance
IMPRESSION: No mammographic or sonographic evidence of malignancy.  Yearly
screening mammography is suggested.

BI-RADS CATEGORY 2:  Benign finding(s).

## 2011-09-19 IMAGING — CR DG TIBIA/FIBULA 2V*L*
2 series · 2 of 2 positions shown · non-contrast
Comparison: None.

CLINICAL DATA: Left leg pain.  Injury.

LEFT TIBIA AND FIBULA - 2 VIEW

[view not recorded (1 of 2)]
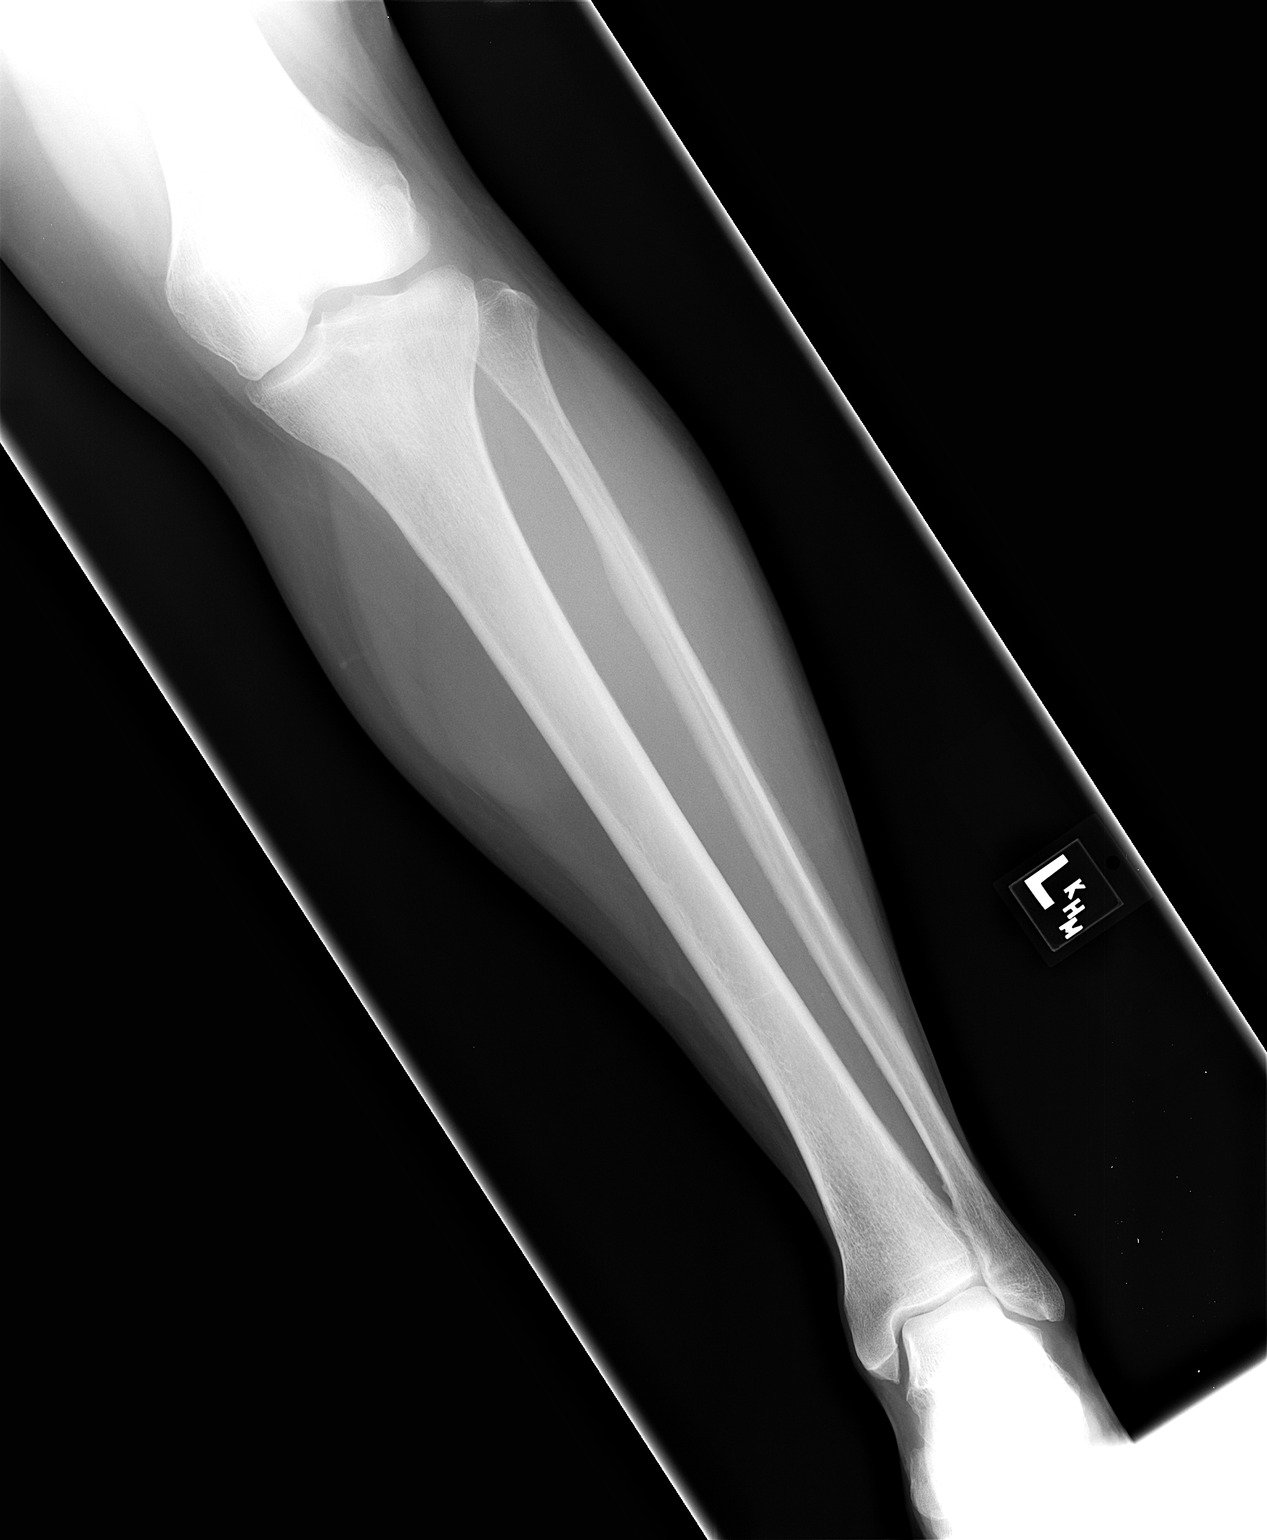

[view not recorded (2 of 2)]
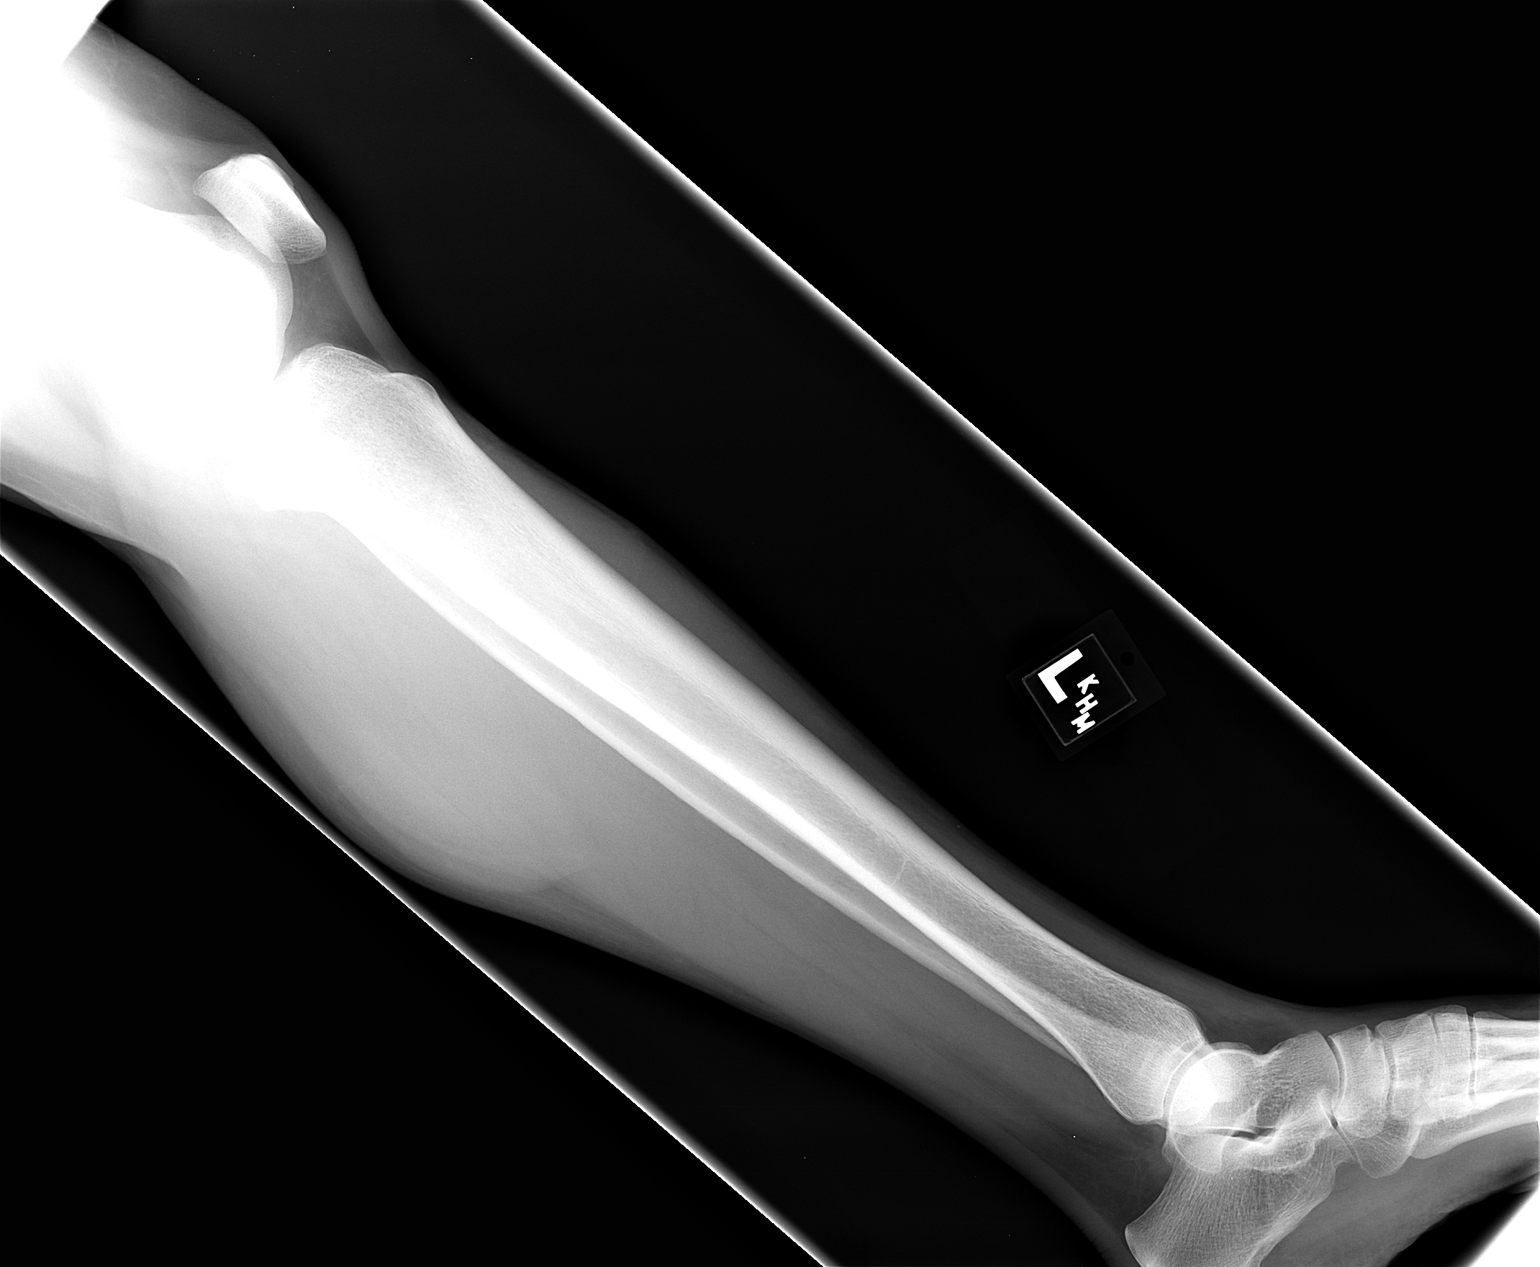

[2 of 2 positions shown; findings below may reference images not displayed]

FINDINGS: Imaged bones, joints and soft tissues appear normal.
IMPRESSION: Negative exam.

## 2014-12-17 ENCOUNTER — Encounter (INDEPENDENT_AMBULATORY_CARE_PROVIDER_SITE_OTHER): Payer: Self-pay | Admitting: *Deleted

## 2015-01-07 ENCOUNTER — Ambulatory Visit (INDEPENDENT_AMBULATORY_CARE_PROVIDER_SITE_OTHER): Payer: Self-pay | Admitting: Internal Medicine

## 2015-02-09 ENCOUNTER — Ambulatory Visit (INDEPENDENT_AMBULATORY_CARE_PROVIDER_SITE_OTHER): Payer: Managed Care, Other (non HMO) | Admitting: Internal Medicine

## 2015-02-09 ENCOUNTER — Other Ambulatory Visit (INDEPENDENT_AMBULATORY_CARE_PROVIDER_SITE_OTHER): Payer: Self-pay | Admitting: *Deleted

## 2015-02-09 ENCOUNTER — Encounter (INDEPENDENT_AMBULATORY_CARE_PROVIDER_SITE_OTHER): Payer: Self-pay | Admitting: *Deleted

## 2015-02-09 ENCOUNTER — Encounter (INDEPENDENT_AMBULATORY_CARE_PROVIDER_SITE_OTHER): Payer: Self-pay | Admitting: Internal Medicine

## 2015-02-09 VITALS — BP 130/80 | HR 78 | Temp 98.3°F | Resp 18 | Ht <= 58 in | Wt 150.9 lb

## 2015-02-09 DIAGNOSIS — K625 Hemorrhage of anus and rectum: Secondary | ICD-10-CM | POA: Diagnosis not present

## 2015-02-09 NOTE — Patient Instructions (Signed)
Colonoscopy to be scheduled. 

## 2015-02-09 NOTE — Progress Notes (Signed)
Presenting complaint;  Rectal bleeding of one year duration.  History of present illness;  Patient is 64 year old Hispanic female who is referred through courtesy of Dr. Donzetta Sprungerry Daniel for evaluation of chronic hematochezia. She states she is noted blood with her bowel movements for at least one year. It occurs intermittently. At times she passes clots and bright red blood. It always occurs with her bowel movements. She denies diarrhea and/or constipation. She does report fullness and discomfort in right lower quadrant abdominal pain which is usually not associated with bleeding episodes. She has good appetite and denies weight loss. She has heartburn only when she speaks of. She denies nausea vomiting or dysphagia. She states she had routine blood work recently and it was normal. Last colonoscopy was 14 years ago for screening purposes while [place] in [place] and was normal.   Current Medications: Tums on as-needed basis which is not very frequent    Past medical history; Bilateral tubal ligation several years ago. Laparoscopic cholecystectomy in August 2007 complicated by biliary leak treated with biliary stenting. She also developed infected biloma drained percutaneously. Normal screening colonoscopy at age 64.  Allergies; No Known Allergies  Family history; Father lived to be 3488. Mother is 65 years old and doing fair. She has four 1/2 brothers and four 1/2 sisters. One brother has coronary artery disease diabetes and kidney problems.  Social history; She is married(second marriage). She has a son aged 65 daughter age 65 in good health. She lost one child 3 days after birth.   Objective: Blood pressure 130/80, pulse 78, temperature 98.3 F (36.8 C), temperature source Oral, resp. rate 18, height 4\' 10"  (1.473 m), weight 150 lb 14.4 oz (68.448 kg). Patient is alert and in no acute distress Conjunctiva is pink. Sclera is nonicteric Oropharyngeal mucosa is normal. No  neck masses or thyromegaly noted. Cardiac exam with regular rhythm normal S1 and S2. No murmur or gallop noted. Lungs are clear to auscultation. Abdomen is symmetrical. Bowel sounds are normal. On palpation abdomen is soft with mild tenderness in RLQ. No organomegaly or masses. Acton examination deferred.  No LE edema or clubbing noted.  Labs/studies Results: Recent lab studies have been requested.  Assessment:  Chronic hematochezia and is still vague right lower quadrant abdominal pain. Recent CBC reportedly normal. Suspect hemorrhoidal bleeding need to rule out other etiologies.   Recommendations;  [facility] Request copy of recent blood work for review.

## 2015-02-15 ENCOUNTER — Encounter (HOSPITAL_COMMUNITY): Payer: Self-pay

## 2015-02-15 ENCOUNTER — Ambulatory Visit (HOSPITAL_COMMUNITY)
Admission: RE | Admit: 2015-02-15 | Discharge: 2015-02-15 | Disposition: A | Discharge status: Home / Self Care | Payer: Managed Care, Other (non HMO) | Source: Ambulatory Visit | Attending: Internal Medicine | Admitting: Internal Medicine

## 2015-02-15 ENCOUNTER — Encounter (HOSPITAL_COMMUNITY)
Admission: RE | Disposition: A | Discharge status: Home / Self Care | Payer: Self-pay | Source: Ambulatory Visit | Attending: Internal Medicine

## 2015-02-15 DIAGNOSIS — K625 Hemorrhage of anus and rectum: Secondary | ICD-10-CM

## 2015-02-15 DIAGNOSIS — K921 Melena: Secondary | ICD-10-CM | POA: Diagnosis present

## 2015-02-15 DIAGNOSIS — K648 Other hemorrhoids: Secondary | ICD-10-CM | POA: Insufficient documentation

## 2015-02-15 DIAGNOSIS — D649 Anemia, unspecified: Secondary | ICD-10-CM | POA: Insufficient documentation

## 2015-02-15 DIAGNOSIS — K644 Residual hemorrhoidal skin tags: Secondary | ICD-10-CM | POA: Insufficient documentation

## 2015-02-15 DIAGNOSIS — Z9851 Tubal ligation status: Secondary | ICD-10-CM | POA: Insufficient documentation

## 2015-02-15 DIAGNOSIS — Z9049 Acquired absence of other specified parts of digestive tract: Secondary | ICD-10-CM | POA: Diagnosis not present

## 2015-02-15 DIAGNOSIS — K649 Unspecified hemorrhoids: Secondary | ICD-10-CM | POA: Diagnosis not present

## 2015-02-15 DIAGNOSIS — K5730 Diverticulosis of large intestine without perforation or abscess without bleeding: Secondary | ICD-10-CM | POA: Insufficient documentation

## 2015-02-15 HISTORY — DX: Adverse effect of unspecified anesthetic, initial encounter: T41.45XA

## 2015-02-15 HISTORY — PX: COLONOSCOPY: SHX5424

## 2015-02-15 HISTORY — DX: Other complications of anesthesia, initial encounter: T88.59XA

## 2015-02-15 SURGERY — COLONOSCOPY
Anesthesia: Moderate Sedation

## 2015-02-15 MED ORDER — MEPERIDINE HCL 50 MG/ML IJ SOLN
INTRAMUSCULAR | Status: DC | PRN
  Administered 2015-02-15 (×2): 25 mg

## 2015-02-15 MED ORDER — SODIUM CHLORIDE 0.9 % IV SOLN
INTRAVENOUS | Status: DC
  Administered 2015-02-15: 07:00:00 via INTRAVENOUS

## 2015-02-15 MED ORDER — MIDAZOLAM HCL 5 MG/5ML IJ SOLN
INTRAMUSCULAR | Status: DC | PRN
  Administered 2015-02-15 (×3): 2 mg via INTRAVENOUS

## 2015-02-15 MED ORDER — HYDROCORTISONE ACETATE 25 MG RE SUPP: 25.0000 mg | Freq: Every day | RECTAL | Status: AC

## 2015-02-15 MED ORDER — HYOSCYAMINE SULFATE 0.125 MG SL SUBL: 0.1250 mg | SUBLINGUAL_TABLET | Freq: Three times a day (TID) | SUBLINGUAL | Status: AC | PRN

## 2015-02-15 MED ORDER — STERILE WATER FOR IRRIGATION IR SOLN
Status: DC | PRN
  Administered 2015-02-15: 07:00:00

## 2015-02-15 MED ORDER — BENEFIBER DRINK MIX PO PACK: 4.0000 g | PACK | Freq: Every day | ORAL | Status: AC

## 2015-02-15 MED ORDER — MIDAZOLAM HCL 5 MG/5ML IJ SOLN
INTRAMUSCULAR | Status: AC
  Filled 2015-02-15: qty 10

## 2015-02-15 MED ORDER — MEPERIDINE HCL 50 MG/ML IJ SOLN
INTRAMUSCULAR | Status: AC
  Filled 2015-02-15: qty 1

## 2015-02-15 NOTE — Discharge Instructions (Signed)
Hyoscyamine sublingual 1 tablet 3 times a day as needed for pain. Anusol HC suppository 1 per rectum daily at bedtime for 2 weeks.  High fiber diet. Benefiber 4 g by mouth daily at bedtime. Keep symptom diary as to frequency of rectal bleeding for three months and call us with progress report.   Colonoscopy, Care After Refer to this sheet in the next few weeks. These instructions provide you with information on caring for yourself after your procedure. Your health care provider may also give you more specific instructions. Your treatment has been planned according to current medical practices, but problems sometimes occur. Call your health care provider if you have any problems or questions after your procedure. WHAT TO EXPECT AFTER THE PROCEDURE  After your procedure, it is typical to have the following:  A small amount of blood in your stool.  Moderate amounts of gas and mild abdominal cramping or bloating. HOME CARE INSTRUCTIONS  Do not drive, operate machinery, or sign important documents for 24 hours.  You may shower and resume your regular physical activities, but move at a slower pace for the first 24 hours.  Take frequent rest periods for the first 24 hours.  Walk around or put a warm pack on your abdomen to help reduce abdominal cramping and bloating.  Drink enough fluids to keep your urine clear or pale yellow.  You may resume your normal diet as instructed by your health care provider. Avoid heavy or fried foods that are hard to digest.  Avoid drinking alcohol for 24 hours or as instructed by your health care provider.  Only take over-the-counter or prescription medicines as directed by your health care provider.  If a tissue sample (biopsy) was taken during your procedure:  Do not take aspirin or blood thinners for 7 days, or as instructed by your health care provider.  Do not drink alcohol for 7 days, or as instructed by your health care provider.  Eat soft foods  for the first 24 hours. SEEK MEDICAL CARE IF: You have persistent spotting of blood in your stool 2-3 days after the procedure. SEEK IMMEDIATE MEDICAL CARE IF:  You have more than a small spotting of blood in your stool.  You pass large blood clots in your stool.  Your abdomen is swollen (distended).  You have nausea or vomiting.  You have a fever.  You have increasing abdominal pain that is not relieved with medicine. Document Released: 05/30/2004 Document Revised: 08/06/2013 Document Reviewed: 06/23/2013 [facility] Patient Information 2015 [place]. This information is not intended to replace advice given to you by your health care provider. Make sure you discuss any questions you have with your health care provider.   High-Fiber Diet Fiber is found in fruits, vegetables, and grains. A high-fiber diet encourages the addition of more whole grains, legumes, fruits, and vegetables in your diet. The recommended amount of fiber for adult males is 38 g per day. For adult females, it is 25 g per day. Pregnant and lactating women should get 28 g of fiber per day. If you have a digestive or bowel problem, ask your caregiver for advice before adding high-fiber foods to your diet. Eat a variety of high-fiber foods instead of only a select few type of foods.  PURPOSE  To increase stool bulk.  To make bowel movements more regular to prevent constipation.  To lower cholesterol.  To prevent overeating. WHEN IS THIS DIET USED?  It may be used if you have constipation and  hemorrhoids.  It may be used if you have uncomplicated diverticulosis (intestine condition) and irritable bowel syndrome.  It may be used if you need help with weight management.  It may be used if you want to add it to your diet as a protective measure against atherosclerosis, diabetes, and cancer. SOURCES OF FIBER  Whole-grain breads and cereals.  Fruits, such as apples, oranges, bananas, berries, prunes, and  pears.  Vegetables, such as green peas, carrots, sweet potatoes, beets, broccoli, cabbage, spinach, and artichokes.  Legumes, such split peas, soy, lentils.  Almonds. FIBER CONTENT IN FOODS Starches and Grains / Dietary Fiber (g)  Cheerios, 1 cup / 3 g  Corn Flakes cereal, 1 cup / 0.7 g  Rice crispy treat cereal, 1 cup / 0.3 g  Instant oatmeal (cooked),  cup / 2 g  Frosted wheat cereal, 1 cup / 5.1 g  Brown, long-grain rice (cooked), 1 cup / 3.5 g  White, long-grain rice (cooked), 1 cup / 0.6 g  Enriched macaroni (cooked), 1 cup / 2.5 g Legumes / Dietary Fiber (g)  Baked beans (canned, plain, or vegetarian),  cup / 5.2 g  Kidney beans (canned),  cup / 6.8 g  Pinto beans (cooked),  cup / 5.5 g Breads and Crackers / Dietary Fiber (g)  Plain or honey graham crackers, 2 squares / 0.7 g  Saltine crackers, 3 squares / 0.3 g  Plain, salted pretzels, 10 pieces / 1.8 g  Whole-wheat bread, 1 slice / 1.9 g  White bread, 1 slice / 0.7 g  Raisin bread, 1 slice / 1.2 g  Plain bagel, 3 oz / 2 g  Flour tortilla, 1 oz / 0.9 g  Corn tortilla, 1 small / 1.5 g  Hamburger or hotdog bun, 1 small / 0.9 g Fruits / Dietary Fiber (g)  Apple with skin, 1 medium / 4.4 g  Sweetened applesauce,  cup / 1.5 g  Banana,  medium / 1.5 g  Grapes, 10 grapes / 0.4 g  Orange, 1 small / 2.3 g  Raisin, 1.5 oz / 1.6 g  Melon, 1 cup / 1.4 g Vegetables / Dietary Fiber (g)  Green beans (canned),  cup / 1.3 g  Carrots (cooked),  cup / 2.3 g  Broccoli (cooked),  cup / 2.8 g  Peas (cooked),  cup / 4.4 g  Mashed potatoes,  cup / 1.6 g  Lettuce, 1 cup / 0.5 g  Corn (canned),  cup / 1.6 g  Tomato,  cup / 1.1 g Document Released: 10/16/2005 Document Revised: 04/16/2012 Document Reviewed: 01/18/2012 ExitCare Patient Information 2015 [place], [place]. This information is not intended to replace advice given to you by your health care provider. Make sure you discuss any  questions you have with your health care provider.

## 2015-02-15 NOTE — Op Note (Signed)
COLONOSCOPY PROCEDURE REPORT  PATIENT:  Cassandra Welch  MR#:  [id number] Birthdate:  04/09/1951, 64 y.o., female Endoscopist:  Dr. Malissa HippoNajeeb U. Rehman, MD Referred By:  Dr. Donzetta Sprungerry Daniel, MD  Procedure Date: 02/15/2015  Procedure:   Colonoscopy  Indications:  Patient is 64 year old Hispanic female who presents with one-year history of intermittent hematochezia. She denies diarrhea constipation anorexia weight loss. She has intermittent left-sided abdominal pain. Recent H&H was normal.  Informed Consent:  The procedure and risks were reviewed with the patient and informed consent was obtained.  Medications:  Demerol 50 mg IV Versed 6 mg IV  Description of procedure:  After a digital rectal exam was performed, that colonoscope was advanced from the anus through the rectum and colon to the area of the cecum, ileocecal valve and appendiceal orifice. The cecum was deeply intubated. These structures were well-seen and photographed for the record. From the level of the cecum and ileocecal valve, the scope was slowly and cautiously withdrawn. The mucosal surfaces were carefully surveyed utilizing scope tip to flexion to facilitate fold flattening as needed. The scope was pulled down into the rectum where a thorough exam including retroflexion was performed.  Findings:   Prep excellent. 2 diverticula noted at ascending colon above and across from ileocecal valve. Normal mucosa throughout. Normal rectal mucosa. Prominent hemorrhoids above the dentate line with erythema. Small hemorrhoids below the dentate line.   Therapeutic/Diagnostic Maneuvers Performed:  None  Complications:  None  Cecal Withdrawal Time:  9 minutes  Impression:  Examination performed to cecum. Two diverticula at ascending colon. Internal and external hemorrhoids.  Recommendations:  Standard instructions given. Hyoscyamine sublingual 1 tablet 3 times a day when necessary. High fiber diet. Benefiber 4 g by mouth daily at  bedtime. Anusol suppository 1 per rectum daily at bedtime for 2 weeks. Patient will call with progress report in 3 months.  REHMAN,NAJEEB U  02/15/2015 8:00 AM  CC: Dr. Donzetta SprungANIEL, TERRY, MD & Dr. Bonnetta BarryNo ref. provider found

## 2015-02-15 NOTE — H&P (Signed)
Cassandra Welch is an 64 y.o. female.   Chief Complaint: Patient is here for colonoscopy. HPI: Patient is 64 year old female who presents with one-year history of intermittent rectal bleeding which always occurs with bowel movements. She describes the amount to be small to moderate. She has not passed any blood since her office visit last week. She has noted mild pain left lower and mid abdomen. She denies diarrhea constipation anorexia weight loss. Family history negative for CRC. Last colonoscopy was over 10 years ago.   Past Medical History  Diagnosis Date  . Rectal bleeding   . Anemia     As a child  . Complication of anesthesia     States her BP always goes "really high with anesthesia".    Past Surgical History  Procedure Laterality Date  . Cholecystectomy  August -2006-2007    Dr.DeMason  . Colonoscopy    . Tubal ligation  01/19/1980    Family History  Problem Relation Age of Onset  . Healthy Mother   . Bipolar disorder Daughter   . Healthy Son    Social History:  reports that she has never smoked. She has never used smokeless tobacco. She reports that she does not drink alcohol or use illicit drugs.  Allergies: No Known Allergies  No prescriptions prior to admission    No results found for this or any previous visit (from the past 48 hour(s)). No results found.  ROS  Blood pressure 152/73, pulse 56, temperature 97.5 F (36.4 C), temperature source Oral, resp. rate 18, height 4\' 10"  (1.473 m), weight 150 lb (68.04 kg), SpO2 100 %. Physical Exam  Constitutional: She appears well-developed and well-nourished.  HENT:  Mouth/Throat: Oropharynx is clear and moist.  Eyes: Conjunctivae are normal. No scleral icterus.  Neck: No thyromegaly present.  Cardiovascular: Normal rate, regular rhythm and normal heart sounds.   No murmur heard. Respiratory: Effort normal and breath sounds normal.  GI: Soft. She exhibits no distension and no mass. There is tenderness (mild  tenderness at left mid abdomen).  Lymphadenopathy:    She has no cervical adenopathy.  Neurological: She is alert.  Skin: Skin is warm and dry.     Assessment/Plan Chronic hematochezia. Diagnostic colonoscopy  Brayon Bielefeld U 02/15/2015, 7:27 AM

## 2015-02-17 ENCOUNTER — Encounter (HOSPITAL_COMMUNITY): Payer: Self-pay | Admitting: Internal Medicine

## 2015-02-18 ENCOUNTER — Encounter (INDEPENDENT_AMBULATORY_CARE_PROVIDER_SITE_OTHER): Payer: Self-pay

## 2019-12-13 ENCOUNTER — Ambulatory Visit: Payer: Self-pay | Attending: Internal Medicine

## 2019-12-13 ENCOUNTER — Ambulatory Visit: Payer: Self-pay

## 2019-12-13 ENCOUNTER — Other Ambulatory Visit: Payer: Self-pay

## 2019-12-13 DIAGNOSIS — Z23 Encounter for immunization: Secondary | ICD-10-CM | POA: Insufficient documentation

## 2019-12-13 NOTE — Progress Notes (Signed)
   Covid-19 Vaccination Clinic  Name:  Cassandra Welch    MRN: [id number] DOB: Oct 01, 1951  12/13/2019  Cassandra Welch was observed post Covid-19 immunization for 15 minutes without incidence. She was provided with Vaccine Information Sheet and instruction to access the V-Safe system.   Cassandra Welch was instructed to call 911 with any severe reactions post vaccine: Marland Kitchen Difficulty breathing  . Swelling of your face and throat  . A fast heartbeat  . A bad rash all over your body  . Dizziness and weakness    Immunizations Administered    Name Date Dose VIS Date Route   Moderna COVID-19 Vaccine 12/13/2019 11:35 AM 0.5 mL 09/30/2019 Intramuscular   Manufacturer: Moderna   Lot: [id number]   NDC: 51884-166-06

## 2020-01-10 ENCOUNTER — Ambulatory Visit: Payer: Self-pay | Attending: Internal Medicine

## 2020-01-10 DIAGNOSIS — Z23 Encounter for immunization: Secondary | ICD-10-CM

## 2020-01-10 NOTE — Progress Notes (Signed)
   Covid-19 Vaccination Clinic  Name:  Cassandra Welch    MRN: [id number] DOB: 10/18/1951  01/10/2020  [name] was observed post Covid-19 immunization for 15 minutes without incident. She was provided with Vaccine Information Sheet and instruction to access the V-Safe system.   [name] was instructed to call 911 with any severe reactions post vaccine: Marland Kitchen Difficulty breathing  . Swelling of face and throat  . A fast heartbeat  . A bad rash all over body  . Dizziness and weakness   Immunizations Administered    Name Date Dose VIS Date Route   Moderna COVID-19 Vaccine 01/10/2020 10:05 AM 0.5 mL 09/30/2019 Intramuscular   Manufacturer: Moderna   Lot: [id number]   NDC: 30131-438-88
